# Patient Record
Sex: Female | Born: 1975 | Race: Black or African American | Hispanic: No | Marital: Married | Smoking: Never smoker
Health system: Southern US, Community
[De-identification: ages and names within clinical notes are randomized; demographics above are authoritative.]

## PROBLEM LIST (undated history)

## (undated) HISTORY — PX: FOOT SURGERY: SHX648

---

## 2006-08-22 ENCOUNTER — Emergency Department: Payer: Self-pay | Admitting: Emergency Medicine

## 2009-03-15 ENCOUNTER — Ambulatory Visit: Payer: Self-pay | Admitting: Internal Medicine

## 2010-08-01 ENCOUNTER — Ambulatory Visit: Payer: Self-pay | Admitting: Internal Medicine

## 2011-05-27 ENCOUNTER — Ambulatory Visit: Payer: Self-pay | Admitting: Internal Medicine

## 2011-07-18 ENCOUNTER — Ambulatory Visit: Payer: Self-pay

## 2012-01-24 ENCOUNTER — Ambulatory Visit: Payer: Self-pay | Admitting: Emergency Medicine

## 2012-01-24 LAB — WET PREP, GENITAL

## 2012-04-03 ENCOUNTER — Emergency Department: Payer: Self-pay | Admitting: Emergency Medicine

## 2012-04-03 LAB — CBC
HGB: 13.2 g/dL (ref 12.0–16.0)
MCH: 30.8 pg (ref 26.0–34.0)
Platelet: 195 10*3/uL (ref 150–440)

## 2012-04-04 LAB — BASIC METABOLIC PANEL
Anion Gap: 10 (ref 7–16)
BUN: 12 mg/dL (ref 7–18)
Chloride: 103 mmol/L (ref 98–107)
Co2: 27 mmol/L (ref 21–32)
EGFR (African American): >60
EGFR (Non-African Amer.): >60
Potassium: 3.9 mmol/L (ref 3.5–5.1)

## 2012-04-04 LAB — CK TOTAL AND CKMB (NOT AT ARMC)
CK, Total: 168 U/L (ref 21–215)
CK-MB: 0.8 ng/mL (ref 0.5–3.6)

## 2012-04-04 LAB — TROPONIN I: Troponin-I: <0.02 ng/mL

## 2012-11-27 ENCOUNTER — Emergency Department: Payer: Self-pay | Admitting: Emergency Medicine

## 2012-11-27 LAB — COMPREHENSIVE METABOLIC PANEL
Albumin: 3.9 g/dL (ref 3.4–5.0)
Anion Gap: 5 — ABNORMAL LOW (ref 7–16)
BUN: 8 mg/dL (ref 7–18)
Calcium, Total: 9 mg/dL (ref 8.5–10.1)
Chloride: 106 mmol/L (ref 98–107)
Co2: 28 mmol/L (ref 21–32)
Creatinine: 0.91 mg/dL (ref 0.60–1.30)
EGFR (Non-African Amer.): >60
Glucose: 97 mg/dL (ref 65–99)
Potassium: 3.6 mmol/L (ref 3.5–5.1)
SGPT (ALT): 27 U/L (ref 12–78)
Sodium: 139 mmol/L (ref 136–145)
Total Protein: 7.6 g/dL (ref 6.4–8.2)

## 2012-11-27 LAB — CBC
HCT: 37.5 % (ref 35.0–47.0)
HGB: 12.6 g/dL (ref 12.0–16.0)
MCH: 30 pg (ref 26.0–34.0)
MCHC: 33.7 g/dL (ref 32.0–36.0)
MCV: 89 fL (ref 80–100)
Platelet: 205 10*3/uL (ref 150–440)
RBC: 4.21 10*6/uL (ref 3.80–5.20)
WBC: 6.7 10*3/uL (ref 3.6–11.0)

## 2012-12-03 ENCOUNTER — Ambulatory Visit: Payer: Self-pay | Admitting: Gastroenterology

## 2012-12-20 ENCOUNTER — Ambulatory Visit: Payer: Self-pay | Admitting: Gastroenterology

## 2013-02-22 ENCOUNTER — Ambulatory Visit: Payer: Self-pay | Admitting: Physician Assistant

## 2013-05-06 ENCOUNTER — Ambulatory Visit: Payer: Self-pay | Admitting: Family Medicine

## 2013-05-06 LAB — URINALYSIS, COMPLETE
Bilirubin,UR: NEGATIVE
Blood: NEGATIVE
Glucose,UR: NEGATIVE mg/dL (ref 0–75)
KETONE: NEGATIVE
Nitrite: NEGATIVE
PROTEIN: NEGATIVE
Ph: 7.5 (ref 4.5–8.0)
Specific Gravity: 1.005 (ref 1.003–1.030)

## 2013-05-06 LAB — GC/CHLAMYDIA PROBE AMP

## 2013-05-06 LAB — WET PREP, GENITAL

## 2013-09-19 ENCOUNTER — Ambulatory Visit: Payer: Self-pay | Admitting: Physician Assistant

## 2014-08-15 IMAGING — CR RIGHT GREAT TOE
3 series · 3 of 3 positions shown · non-contrast
Comparison: None.

CLINICAL DATA: Laceration of the left great toe secondary to blunt
trauma.

EXAM:
RIGHT GREAT TOE

[toe ap]
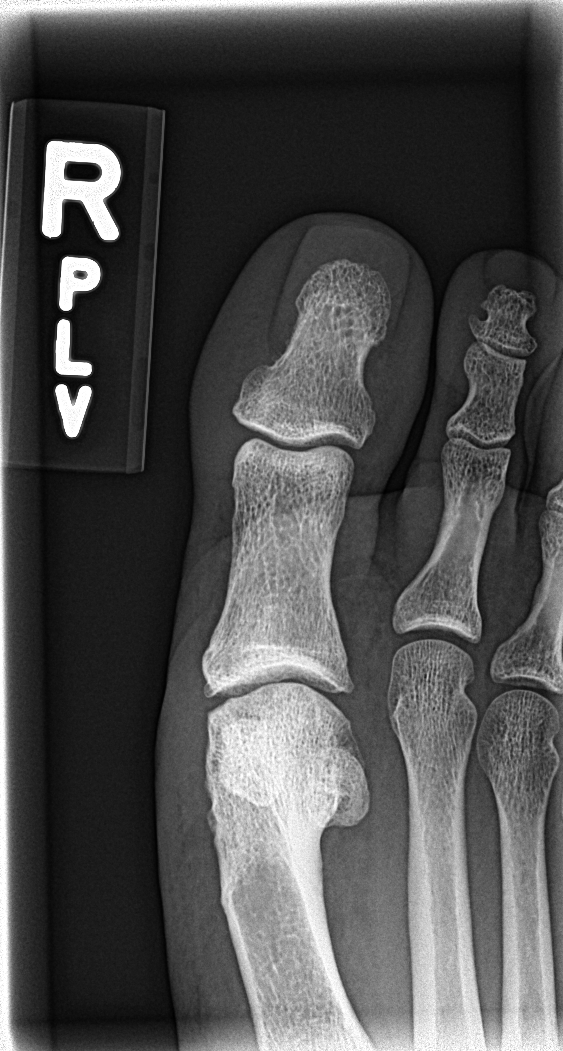

[toe obl]
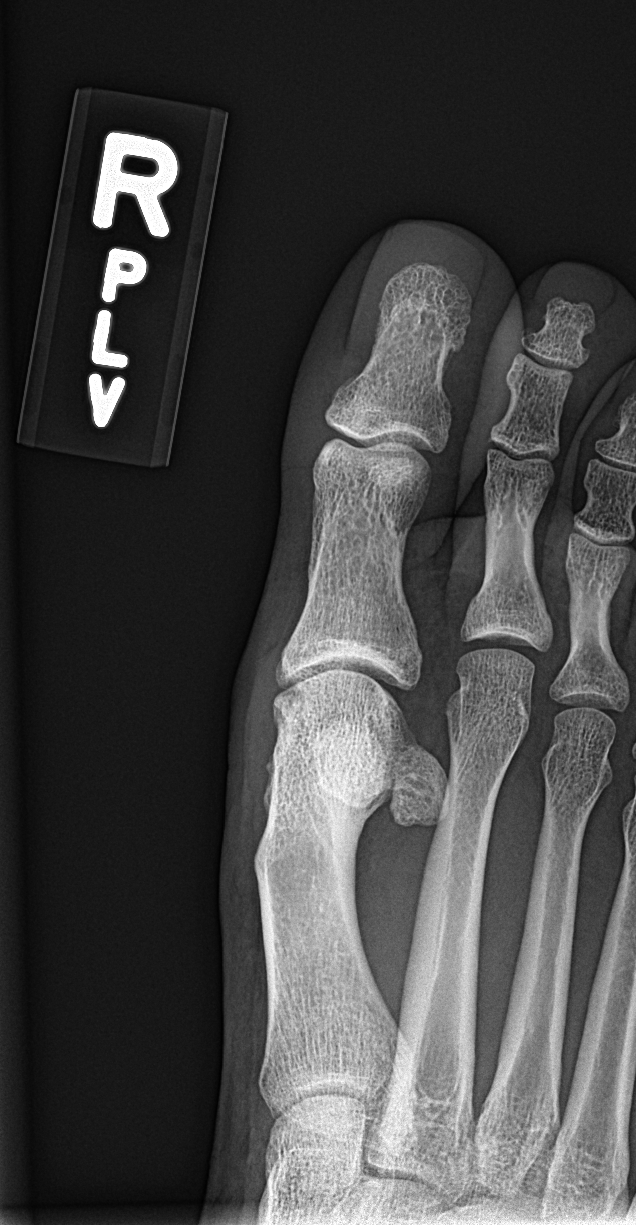

[toe lat]
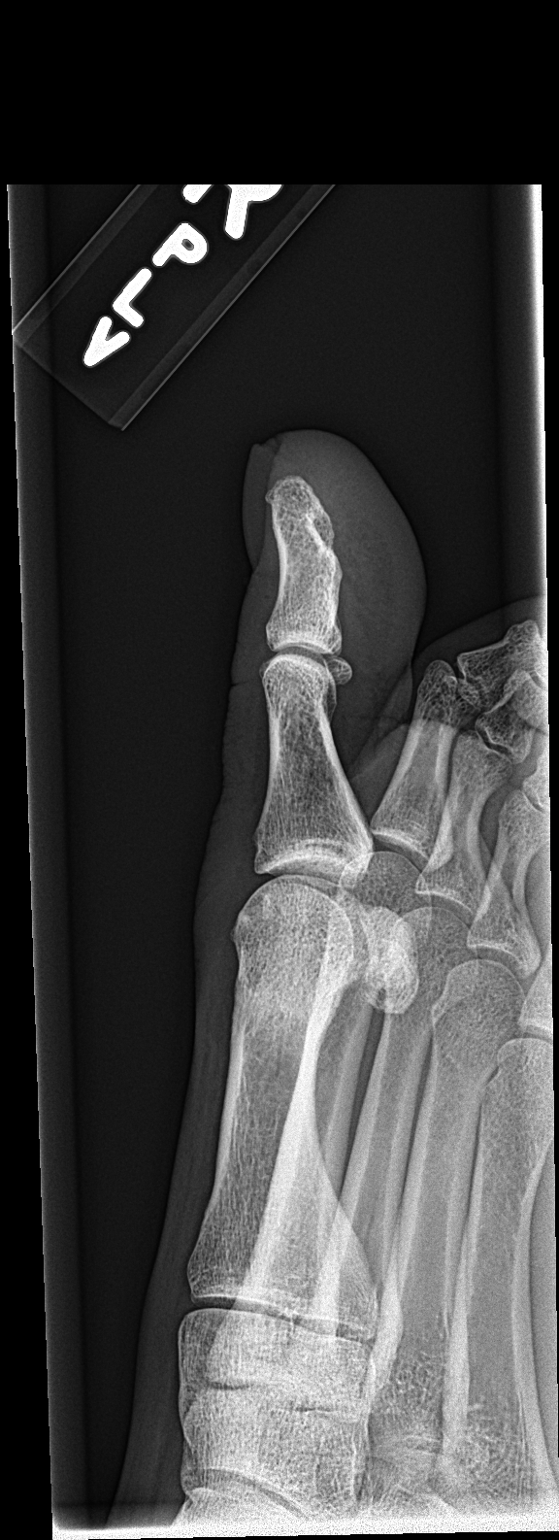

[3 of 3 positions shown; findings below may reference images not displayed]

FINDINGS: There is no fracture or dislocation. Slight osteoarthritis of the
first metatarsophalangeal joint. Findings suggest previous
bunionectomy.
IMPRESSION: No acute abnormalities.  DJD of the first metatarsophalangeal joint.

## 2015-01-06 ENCOUNTER — Encounter: Payer: Self-pay | Admitting: Emergency Medicine

## 2015-01-06 ENCOUNTER — Ambulatory Visit
Admission: EM | Admit: 2015-01-06 | Discharge: 2015-01-06 | Disposition: A | Discharge status: Home / Self Care | Payer: 59 | Attending: Family Medicine | Admitting: Family Medicine

## 2015-01-06 DIAGNOSIS — B349 Viral infection, unspecified: Secondary | ICD-10-CM

## 2015-01-06 LAB — RAPID STREP SCREEN (MED CTR MEBANE ONLY): Streptococcus, Group A Screen (Direct): NEGATIVE

## 2015-01-06 MED ORDER — BENZONATATE 100 MG PO CAPS: 100.0000 mg | ORAL_CAPSULE | Freq: Three times a day (TID) | ORAL | Status: AC | PRN

## 2015-01-06 MED ORDER — LORATADINE-PSEUDOEPHEDRINE ER 5-120 MG PO TB12: 1.0000 | ORAL_TABLET | Freq: Two times a day (BID) | ORAL | Status: AC

## 2015-01-06 NOTE — Discharge Instructions (Signed)
Treat symptoms..increase fluids..... tylenol /ibuprofen cyclically. Steamy showers  Robitussin DM  ( Guaifenesin DM )  1 tsp every  4 hours to liquefy mucous Viral Infections A viral infection can be caused by different types of viruses.Most viral infections are not serious and resolve on their own. However, some infections may cause severe symptoms and may lead to further complications. SYMPTOMS Viruses can frequently cause:  Minor sore throat.  Aches and pains.  Headaches.  Runny nose.  Different types of rashes.  Watery eyes.  Tiredness.  Cough.  Loss of appetite.  Gastrointestinal infections, resulting in nausea, vomiting, and diarrhea. These symptoms do not respond to antibiotics because the infection is not caused by bacteria. However, you might catch a bacterial infection following the viral infection. This is sometimes called a "superinfection." Symptoms of such a bacterial infection may include:  Worsening sore throat with pus and difficulty swallowing.  Swollen neck glands.  Chills and a high or persistent fever.  Severe headache.  Tenderness over the sinuses.  Persistent overall ill feeling (malaise), muscle aches, and tiredness (fatigue).  Persistent cough.  Yellow, green, or brown mucus production with coughing. HOME CARE INSTRUCTIONS   Only take over-the-counter or prescription medicines for pain, discomfort, diarrhea, or fever as directed by your caregiver.  Drink enough water and fluids to keep your urine clear or pale yellow. Sports drinks can provide valuable electrolytes, sugars, and hydration.  Get plenty of rest and maintain proper nutrition. Soups and broths with crackers or rice are fine. SEEK IMMEDIATE MEDICAL CARE IF:   You have severe headaches, shortness of breath, chest pain, neck pain, or an unusual rash.  You have uncontrolled vomiting, diarrhea, or you are unable to keep down fluids.  You or your child has an oral temperature  above 102 F (38.9 C), not controlled by medicine.  Your baby is older than 3 months with a rectal temperature of 102 F (38.9 C) or higher.  Your baby is 643 months old or younger with a rectal temperature of 100.4 F (38 C) or higher. MAKE SURE YOU:   Understand these instructions.  Will watch your condition.  Will get help right away if you are not doing well or get worse.   This information is not intended to replace advice given to you by your health care provider. Make sure you discuss any questions you have with your health care provider.   Document Released: 11/27/2004 Document Revised: 05/12/2011 Document Reviewed: 07/26/2014 Elsevier Interactive Patient Education [employer]2016 [employer]vier Inc.

## 2015-01-06 NOTE — ED Notes (Signed)
Patient c/o sore throat, cough, chest congestion, sinus pressure and congestion, and HAs since yesterday.

## 2015-01-07 ENCOUNTER — Encounter: Payer: Self-pay | Admitting: Physician Assistant

## 2015-01-07 NOTE — ED Provider Notes (Signed)
CSN: [id number]     Arrival date & time 01/06/15  1059 History   First MD Initiated Contact with Patient 01/06/15 1206     Chief Complaint  Patient presents with  . Cough  . Sore Throat  . Facial Pain  . Nasal Congestion   (Consider location/radiation/quality/duration/timing/severity/associated sxs/prior Treatment) HPI 39 yo F with nasal congestion, sore throat, sinus pressure ,annoying cough, ear pressure since yesterday. No fever. Husband and 39 yo and 39 yo have all been il ( not strep)l. She is concerned to r/o strep. Has post nasal drip and non-productive cough  Taking Tylenol severe cold . Menses current History reviewed. No pertinent past medical history. Past Surgical History  Procedure Laterality Date  . Foot surgery Right    Family History  Problem Relation Age of Onset  . Heart disease Sister   . Heart attack Maternal Aunt   . Colon cancer Maternal Grandmother   . Colon cancer Cousin    Social History  Substance Use Topics  . Smoking status: Never Smoker   . Smokeless tobacco: None  . Alcohol Use: No   OB History    Gravida Para Term Preterm AB TAB SAB Ectopic Multiple Living   2 2        2      Review of Systems Constitutional: No fever.  Eyes: No visual changes. ENT ; + sore throat., congestion per HPI Cardiovascular:Negative for chest pain/palpitations Respiratory: Negative for shortness of breath Gastrointestinal: No abdominal pain. No nausea,vomiting, diarrhea Genitourinary: Negative for dysuria. Normal urination. Musculoskeletal: Negative for back pain. FROM extremities without pain Skin: Negative for rash Neurological: Negative for headache, focal weakness or numbness  Allergies  Review of patient's allergies indicates no known allergies.  Home Medications   Prior to Admission medications   Medication Sig Start Date End Date Taking? Authorizing Provider  benzonatate (TESSALON) 100 MG capsule Take 1 capsule (100 mg total) by mouth 3 (three) times  daily as needed. 01/06/15   Rae HalstedLaurie W Willis Kuipers, PA-C  loratadine-pseudoephedrine (CLARITIN-D 12 HOUR) 5-120 MG tablet Take 1 tablet by mouth 2 (two) times daily. 01/06/15   Rae HalstedLaurie W Dmitriy Gair, PA-C   Meds Ordered and Administered this Visit  Medications - No data to display  BP 104/53 mmHg  Pulse 84  Temp(Src) 96.9 F (36.1 C) (Tympanic)  Resp 16  Ht 5\' 4"  (1.626 m)  Wt 150 lb (68.04 kg)  BMI 25.73 kg/m2  SpO2 100%  LMP 01/04/2015 (Exact Date) No data found.   Physical Exam   General: NAD, does not appear toxic HEENT:conjugate gaze, conjunctiva clear,mild pharyngeal erythema, no exudate, + cobblestoning nasal congestion, canals clear,no erythema of TMs, no dental  Issues, no facial tenderness Neck: supple,no lymphadenopathy Resp : CT A, bilat Card : RRR Abd:  Not distended Neuro :face symmetric, :PERRLA, EOMI, tongue midline, shoulder adduction, good attention,recall-good memory,ambulatory without assistance   ED Course  Procedures (including critical care time)  Labs Review Labs Reviewed  RAPID STREP SCREEN (NOT AT [facility])  CULTURE, GROUP A STREP ([facility] ONLY)   Results for orders placed or performed during the hospital encounter of 01/06/15  Rapid strep screen  Result Value Ref Range   Streptococcus, Group A Screen (Direct) NEGATIVE NEGATIVE   Imaging Review No results found.      MDM   1. Viral syndrome    Plan: Test/x-ray results and diagnosis reviewed with patient-3 day callback Rx as per orders;  benefits, risks, potential side effects reviewed   Recommend supportive  treatment with cyclic tylenol/ibuprofen, steamy showers, rest ,hydration Seek additional medical care if symptoms worsen or are not improving  Discharge Medication List as of 01/06/2015 12:26 PM    START taking these medications   Details  benzonatate (TESSALON) 100 MG capsule Take 1 capsule (100 mg total) by mouth 3 (three) times daily as needed., Starting 01/06/2015, Until Discontinued, Normal     loratadine-pseudoephedrine (CLARITIN-D 12 HOUR) 5-120 MG tablet Take 1 tablet by mouth 2 (two) times daily., Starting 01/06/2015, Until Discontinued, Normal           Rae Halsted, PA-C 01/07/15 [phone]

## 2015-01-08 LAB — CULTURE, GROUP A STREP (THRC)

## 2015-01-08 NOTE — ED Notes (Signed)
Final report of strep testing negative  

## 2015-04-21 ENCOUNTER — Ambulatory Visit
Admission: EM | Admit: 2015-04-21 | Discharge: 2015-04-21 | Disposition: A | Discharge status: Home / Self Care | Payer: 59 | Attending: Family Medicine | Admitting: Family Medicine

## 2015-04-21 DIAGNOSIS — R0981 Nasal congestion: Secondary | ICD-10-CM | POA: Diagnosis not present

## 2015-04-21 DIAGNOSIS — J3089 Other allergic rhinitis: Secondary | ICD-10-CM | POA: Diagnosis not present

## 2015-04-21 MED ORDER — AMOXICILLIN-POT CLAVULANATE 875-125 MG PO TABS: 1.0000 | ORAL_TABLET | Freq: Two times a day (BID) | ORAL | Status: AC

## 2015-04-21 NOTE — ED Provider Notes (Signed)
CSN: [id number]     Arrival date & time 04/21/15  1200 History   First MD Initiated Contact with Patient 04/21/15 1500     Chief Complaint  Patient presents with  . 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 Wants therapy so sinus infection doesn't develop- Doesn't want antibiotics . Has ENT established in [facility].Hasn't been able to go while a student. 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 Now certified she is trying to become established with new credentials. Face on Face with public and potential employers. History reviewed. No pertinent past medical history. Past Surgical History  Procedure Laterality Date  . Foot surgery Right    Family History  Problem Relation Age of Onset  . Heart disease Sister   . Heart attack Maternal Aunt   . Colon cancer Maternal Grandmother   . Colon cancer Cousin    Social History  Substance Use Topics  . Smoking status: Never Smoker   . Smokeless tobacco: None  . Alcohol Use: No   OB History    Gravida Para Term Preterm AB TAB SAB Ectopic Multiple Living   Review of Systems.Review of 10 systems negative for acute change except as referenced in HPI   Allergies  Review of patient's allergies indicates no known allergies.  Home Medications   Prior to Admission medications   Medication Sig Start Date End Date Taking? Authorizing Provider  fluticasone (FLONASE) 50 MCG/ACT nasal spray Place into both nostrils  daily.   Yes Historical Provider, MD  loratadine-pseudoephedrine (CLARITIN-D 12 HOUR) 5-120 MG tablet Take 1 tablet by mouth 2 (two) times daily. 01/06/15  Yes Rae Halsted, PA-C  amoxicillin-clavulanate (AUGMENTIN) 875-125 MG tablet Take 1 tablet by mouth every 12 (twelve) hours. 04/21/15   Rae Halsted, PA-C  benzonatate (TESSALON) 100 MG capsule Take 1 capsule (100 mg total) by mouth 3 (three) times daily as needed. 01/06/15   Rae Halsted, PA-C   Meds Ordered and Administered this Visit  Medications - No data to display  BP 118/77 mmHg  Pulse 96  Temp(Src) 97.2 F (36.2 C) (Oral)  Resp 17  Ht  (1.626 m)  Wt 155 lb (70.308 kg)  BMI 26.59 kg/m2  SpO2 100%  LMP 04/02/2015 No data found.   Physical Exam   VS noted, WNL  GENERAL : NAD,  HEENT: no pharyngeal erythema,no exudate, positive post nasal drip/cobblestoning;  no erythema of TMs,canals clear, no cervical LAD; eyes clear.  Sinuses dull to percussion, right maxillary mildly tender; nasal congestion RESP: CTA  B , no wheezing, no accessory muscle use CARD: RRR ABD: Not distended NEURO: Good attention, good recall, no gross neuro defecit PSYCH: speech and behavior appropriate   ED Course  Procedures (including critical care time)  Labs Review Labs Reviewed - No data to display  Imaging Review No results found.     MDM   1. Environmental and seasonal allergies   2. Sinus congestion    Recommend increased hydration,  fluids, vaporizer, steamy shower. Try variation in seasonal allergy control with d/c loratadine and try Zyrtec. Take at bedtime- my try BID on a weekend when not working to see if sleepy-short term trial. Different Rx with change dosing may be more drying for post nasal drip  Add robitussin DM to liquefy mucous inner ear/sinuses but minimize cough Advised initially may see more drainage.  Rx for Augmentin given to hold 1-2 weeks then destroy if not needed.  Respond to fever.malaise, increased  pressure or pain in sinuses. - if started take full Rx Needs to travel in next weeks and will be away from home and usual medical care. Diagnosis and treatment discussed.-handout given   Questions fielded, expectations and recommendations reviewed. Discussed follow up and return parameters including no resolution or any worsening condition..  Patient expresses understanding  and agrees to plan.  Will return to [facility] with questions, concerns or exacerbation.     Rae Halsted, PA-C 04/23/15 1540

## 2015-04-21 NOTE — ED Notes (Signed)
Patient complains of sinus pain and pressure. Patient states that symptoms started last Friday and have been worsening. She states that nose is raw and burning. She states that she has tried multiple OTC treatments with no relief.

## 2015-04-23 ENCOUNTER — Encounter: Payer: Self-pay | Admitting: Physician Assistant

## 2015-05-17 ENCOUNTER — Ambulatory Visit
Admission: EM | Admit: 2015-05-17 | Discharge: 2015-05-17 | Disposition: A | Discharge status: Home / Self Care | Payer: 59 | Attending: Family Medicine | Admitting: Family Medicine

## 2015-05-17 DIAGNOSIS — A09 Infectious gastroenteritis and colitis, unspecified: Secondary | ICD-10-CM | POA: Diagnosis not present

## 2015-05-17 DIAGNOSIS — K529 Noninfective gastroenteritis and colitis, unspecified: Secondary | ICD-10-CM

## 2015-05-17 LAB — CBC WITH DIFFERENTIAL/PLATELET
Basophils Absolute: 0 10*3/uL (ref 0–0.1)
Basophils Relative: 0 %
Eosinophils Absolute: 0 10*3/uL (ref 0–0.7)
Eosinophils Relative: 0 %
HCT: 40.6 % (ref 35.0–47.0)
Hemoglobin: 13.2 g/dL (ref 12.0–16.0)
Lymphocytes Relative: 2 %
Lymphs Abs: 0.2 10*3/uL — ABNORMAL LOW (ref 1.0–3.6)
MCH: 28.6 pg (ref 26.0–34.0)
MCHC: 32.6 g/dL (ref 32.0–36.0)
MCV: 87.8 fL (ref 80.0–100.0)
Monocytes Absolute: 0.3 10*3/uL (ref 0.2–0.9)
Monocytes Relative: 3 %
Neutro Abs: 11.4 10*3/uL — ABNORMAL HIGH (ref 1.4–6.5)
Neutrophils Relative %: 95 %
Platelets: 198 10*3/uL (ref 150–440)
RBC: 4.62 MIL/uL (ref 3.80–5.20)
RDW: 14.7 % — ABNORMAL HIGH (ref 11.5–14.5)
WBC: 11.9 10*3/uL — ABNORMAL HIGH (ref 3.6–11.0)

## 2015-05-17 LAB — COMPREHENSIVE METABOLIC PANEL
ALT: 22 U/L (ref 14–54)
AST: 24 U/L (ref 15–41)
Albumin: 4.5 g/dL (ref 3.5–5.0)
Alkaline Phosphatase: 54 U/L (ref 38–126)
Anion gap: 5 (ref 5–15)
BUN: 17 mg/dL (ref 6–20)
CO2: 26 mmol/L (ref 22–32)
Calcium: 8.8 mg/dL — ABNORMAL LOW (ref 8.9–10.3)
Chloride: 104 mmol/L (ref 101–111)
Creatinine, Ser: 0.89 mg/dL (ref 0.44–1.00)
GFR calc Af Amer: >60 mL/min (ref >60)
GFR calc non Af Amer: >60 mL/min (ref >60)
Glucose, Bld: 105 mg/dL — ABNORMAL HIGH (ref 65–99)
Potassium: 4.1 mmol/L (ref 3.5–5.1)
Sodium: 135 mmol/L (ref 135–145)
Total Bilirubin: 0.8 mg/dL (ref 0.3–1.2)
Total Protein: 8.2 g/dL — ABNORMAL HIGH (ref 6.5–8.1)

## 2015-05-17 MED ORDER — ONDANSETRON 8 MG PO TBDP
8.0000 mg | ORAL_TABLET | Freq: Once | ORAL | Status: AC
  Administered 2015-05-17: 8 mg via ORAL

## 2015-05-17 MED ORDER — ONDANSETRON 8 MG PO TBDP: 8.0000 mg | ORAL_TABLET | Freq: Two times a day (BID) | ORAL | Status: DC

## 2015-05-17 NOTE — ED Provider Notes (Signed)
CSN: [id number]     Arrival date & time 05/17/15  1143 History   First MD Initiated Contact with Patient 05/17/15 1235     Chief Complaint  Patient presents with  . Emesis  . Abdominal Pain   (Consider location/radiation/quality/duration/timing/severity/associated sxs/prior Treatment) HPI  This a 40 year old female who presents with the sudden onset of watery diarrhea 1 vomiting 1 followed by frequent dry heaves. The patient states that she ate [facility] [employer] yesterday and she made a pot roast last  Night. She denies any fever or chills. Does have some left upper quadrant pain whenever she has the dry heaves. Not complaining of any abdominal pain. Denies any mucus or blood in the watery diarrhea. She states she feels very weak.     History reviewed. No pertinent past medical history. Past Surgical History  Procedure Laterality Date  . Foot surgery Right    Family History  Problem Relation Age of Onset  . Heart disease Sister   . Heart attack Maternal Aunt   . Colon cancer Maternal Grandmother   . Colon cancer Cousin    Social History  Substance Use Topics  . Smoking status: Never Smoker   . Smokeless tobacco: None  . Alcohol Use: No   OB History    Gravida Para Term Preterm AB TAB SAB Ectopic Multiple Living   2 2        2      Review of Systems  Constitutional: Positive for activity change, appetite change and fatigue. Negative for fever, chills and diaphoresis.  Gastrointestinal: Positive for nausea, vomiting, abdominal pain and diarrhea. Negative for constipation, blood in stool, abdominal distention, anal bleeding and rectal pain.    Allergies  Review of patient's allergies indicates no known allergies.  Home Medications   Prior to Admission medications   Medication Sig Start Date End Date Taking? Authorizing Provider  amoxicillin-clavulanate (AUGMENTIN) 875-125 MG tablet Take 1 tablet by mouth every 12 (twelve) hours. 04/21/15   Rae HalstedLaurie W Lee, PA-C   benzonatate (TESSALON) 100 MG capsule Take 1 capsule (100 mg total) by mouth 3 (three) times daily as needed. 01/06/15   Rae HalstedLaurie W Lee, PA-C  fluticasone [facility](FLONASE) 50 MCG/ACT nasal spray Place into both nostrils daily.    Historical Provider, MD  loratadine-pseudoephedrine (CLARITIN-D 12 HOUR) 5-120 MG tablet Take 1 tablet by mouth 2 (two) times daily. 01/06/15   Rae HalstedLaurie W Lee, PA-C  ondansetron (ZOFRAN ODT) 8 MG disintegrating tablet Take 1 tablet (8 mg total) by mouth 2 (two) times daily. 05/17/15   Lutricia FeilWilliam P Arbor Leer, PA-C   Meds Ordered and Administered this Visit   Medications  ondansetron (ZOFRAN-ODT) disintegrating tablet 8 mg (8 mg Oral Given 05/17/15 1211)    BP 123/84 mmHg  Pulse 106  Temp(Src) 97.7 F (36.5 C) (Tympanic)  Resp 20  Ht 5\' 4"  (1.626 m)  Wt 160 lb (72.576 kg)  BMI 27.45 kg/m2  SpO2 99%  LMP 05/03/2015 No data found.   Physical Exam  Constitutional: She is oriented to person, place, and time. She appears well-developed and well-nourished. No distress.  HENT:  Head: Normocephalic and atraumatic.  Right Ear: External ear normal.  Left Ear: External ear normal.  Eyes: Conjunctivae are normal. Pupils are equal, round, and reactive to light.  Neck: Normal range of motion. Neck supple.  Pulmonary/Chest: Effort normal and breath sounds normal. No respiratory distress. She has no wheezes. She has no rales.  Abdominal: Soft. She exhibits no distension and no mass. There  is no tenderness. There is no rebound and no guarding.  Shows hypotonic bowel sounds  Musculoskeletal: Normal range of motion. She exhibits no edema or tenderness.  Neurological: She is alert and oriented to person, place, and time.  Skin: Skin is warm and dry. She is not diaphoretic.  Psychiatric: She has a normal mood and affect. Her behavior is normal. Judgment and thought content normal.  Nursing note and vitals reviewed.   ED Course  Procedures (including critical care time)  Labs Review Labs  Reviewed  CBC WITH DIFFERENTIAL/PLATELET - Abnormal; Notable for the following:    WBC 11.9 (*)    RDW 14.7 (*)    Neutro Abs 11.4 (*)    Lymphs Abs 0.2 (*)    All other components within normal limits  COMPREHENSIVE METABOLIC PANEL - Abnormal; Notable for the following:    Glucose, Bld 105 (*)    Calcium 8.8 (*)    Total Protein 8.2 (*)    All other components within normal limits    Imaging Review No results found.   Visual Acuity Review  Right Eye Distance:   Left Eye Distance:   Bilateral Distance:    Right Eye Near:   Left Eye Near:    Bilateral Near:         MDM   1. Gastroenteritis presumed infectious    New Prescriptions   ONDANSETRON (ZOFRAN ODT) 8 MG DISINTEGRATING TABLET    Take 1 tablet (8 mg total) by mouth 2 (two) times daily.  Plan: 1. Test/x-ray results and diagnosis reviewed with patient 2. rx as per orders; risks, benefits, potential side effects reviewed with patient 3. Recommend supportive treatment with Increase fluids. May advance to food as tolerated. I've asked her to follow-up with her primary care next week if she is not improving. If she does get worse she needs to go to emergency department 4. F/u prn if symptoms worsen or don't improve     Lutricia Feil, PA-C 05/17/15 1445

## 2015-05-17 NOTE — ED Notes (Addendum)
Patient c/o vomiting since 1am this morning, stomach pain, diarrhea, weakness, dry heaving, cold sweats.  All symptoms started since 1:00am this morning.

## 2015-05-17 NOTE — Discharge Instructions (Signed)
Diarrhea Diarrhea is frequent loose and watery bowel movements. It can cause you to feel weak and dehydrated. Dehydration can cause you to become tired and thirsty, have a dry mouth, and have decreased urination that often is dark yellow. Diarrhea is a sign of another problem, most often an infection that will not last long. In most cases, diarrhea typically lasts 2-3 days. However, it can last longer if it is a sign of something more serious. It is important to treat your diarrhea as directed by your caregiver to lessen or prevent future episodes of diarrhea. CAUSES  Some common causes include:  Gastrointestinal infections caused by viruses, bacteria, or parasites.  Food poisoning or food allergies.  Certain medicines, such as antibiotics, chemotherapy, and laxatives.  Artificial sweeteners and fructose.  Digestive disorders. HOME CARE INSTRUCTIONS  Ensure adequate fluid intake (hydration): Have 1 cup (8 oz) of fluid for each diarrhea episode. Avoid fluids that contain simple sugars or sports drinks, fruit juices, whole milk products, and sodas. Your urine should be clear or pale yellow if you are drinking enough fluids. Hydrate with an oral rehydration solution that you can purchase at pharmacies, retail stores, and online. You can prepare an oral rehydration solution at home by mixing the following ingredients together:   - tsp table salt.   tsp baking soda.   tsp salt substitute containing potassium chloride.  1  tablespoons sugar.  1 L (34 oz) of water.  Certain foods and beverages may increase the speed at which food moves through the gastrointestinal (GI) tract. These foods and beverages should be avoided and include:  Caffeinated and alcoholic beverages.  High-fiber foods, such as raw fruits and vegetables, nuts, seeds, and whole grain breads and cereals.  Foods and beverages sweetened with sugar alcohols, such as xylitol, sorbitol, and mannitol.  Some foods may be well  tolerated and may help thicken stool including:  Starchy foods, such as rice, toast, pasta, low-sugar cereal, oatmeal, grits, baked potatoes, crackers, and bagels.  Bananas.  Applesauce.  Add probiotic-rich foods to help increase healthy bacteria in the GI tract, such as yogurt and fermented milk products.  Wash your hands well after each diarrhea episode.  Only take over-the-counter or prescription medicines as directed by your caregiver.  Take a warm bath to relieve any burning or pain from frequent diarrhea episodes. SEEK IMMEDIATE MEDICAL CARE IF:   You are unable to keep fluids down.  You have persistent vomiting.  You have blood in your stool, or your stools are black and tarry.  You do not urinate in 6-8 hours, or there is only a small amount of very dark urine.  You have abdominal pain that increases or localizes.  You have weakness, dizziness, confusion, or light-headedness.  You have a severe headache.  Your diarrhea gets worse or does not get better.  You have a fever or persistent symptoms for more than 2-3 days.  You have a fever and your symptoms suddenly get worse. MAKE SURE YOU:   Understand these instructions.  Will watch your condition.  Will get help right away if you are not doing well or get worse.   This information is not intended to replace advice given to you by your health care provider. Make sure you discuss any questions you have with your health care provider.   Document Released: 02/07/2002 Document Revised: 03/10/2014 Document Reviewed: 10/26/2011 Elsevier Interactive Patient Education 2016 [employer]   Food Choices to Help Relieve Diarrhea, Adult When you have   diarrhea, the foods you eat and your eating habits are very important. Choosing the right foods and drinks can help relieve diarrhea. Also, because diarrhea can last up to 7 days, you need to replace lost fluids and electrolytes (such as sodium, potassium, and chloride) in  order to help prevent dehydration.  WHAT GENERAL GUIDELINES DO I NEED TO FOLLOW?  Slowly drink 1 cup (8 oz) of fluid for each episode of diarrhea. If you are getting enough fluid, your urine will be clear or pale yellow.  Eat starchy foods. Some good choices include white rice, white toast, pasta, low-fiber cereal, baked potatoes (without the skin), saltine crackers, and bagels.  Avoid large servings of any cooked vegetables.  Limit fruit to two servings per day. A serving is  cup or 1 small piece.  Choose foods with less than 2 g of fiber per serving.  Limit fats to less than 8 tsp (38 g) per day.  Avoid fried foods.  Eat foods that have probiotics in them. Probiotics can be found in certain dairy products.  Avoid foods and beverages that may increase the speed at which food moves through the stomach and intestines (gastrointestinal tract). Things to avoid include:  High-fiber foods, such as dried fruit, raw fruits and vegetables, nuts, seeds, and whole grain foods.  Spicy foods and high-fat foods.  Foods and beverages sweetened with high-fructose corn syrup, honey, or sugar alcohols such as xylitol, sorbitol, and mannitol. WHAT FOODS ARE RECOMMENDED? Grains White rice. White, French, or pita breads (fresh or toasted), including plain rolls, buns, or bagels. White pasta. Saltine, soda, or graham crackers. Pretzels. Low-fiber cereal. Cooked cereals made with water (such as cornmeal, farina, or cream cereals). Plain muffins. Matzo. Melba toast. Zwieback.  Vegetables Potatoes (without the skin). Strained tomato and vegetable juices. Most well-cooked and canned vegetables without seeds. Tender lettuce. Fruits Cooked or canned applesauce, apricots, cherries, fruit cocktail, grapefruit, peaches, pears, or plums. Fresh bananas, apples without skin, cherries, grapes, cantaloupe, grapefruit, peaches, oranges, or plums.  Meat and Other Protein Products Baked or boiled chicken. Eggs. Tofu.  Fish. Seafood. Smooth peanut butter. Ground or well-cooked tender beef, ham, veal, lamb, pork, or poultry.  Dairy Plain yogurt, kefir, and unsweetened liquid yogurt. Lactose-free milk, buttermilk, or soy milk. Plain hard cheese. Beverages Sport drinks. Clear broths. Diluted fruit juices (except prune). Regular, caffeine-free sodas such as ginger ale. Water. Decaffeinated teas. Oral rehydration solutions. Sugar-free beverages not sweetened with sugar alcohols. Other Bouillon, broth, or soups made from recommended foods.  The items listed above may not be a complete list of recommended foods or beverages. Contact your dietitian for more options. WHAT FOODS ARE NOT RECOMMENDED? Grains Whole grain, whole wheat, bran, or rye breads, rolls, pastas, crackers, and cereals. Wild or brown rice. Cereals that contain more than 2 g of fiber per serving. Corn tortillas or taco shells. Cooked or dry oatmeal. Granola. Popcorn. Vegetables Raw vegetables. Cabbage, broccoli, Brussels sprouts, artichokes, baked beans, beet greens, corn, kale, legumes, peas, sweet potatoes, and yams. Potato skins. Cooked spinach and cabbage. Fruits Dried fruit, including raisins and dates. Raw fruits. Stewed or dried prunes. Fresh apples with skin, apricots, mangoes, pears, raspberries, and strawberries.  Meat and Other Protein Products Chunky peanut butter. Nuts and seeds. Beans and lentils. Bacon.  Dairy High-fat cheeses. Milk, chocolate milk, and beverages made with milk, such as milk shakes. Cream. Ice cream. Sweets and Desserts Sweet rolls, doughnuts, and sweet breads. Pancakes and waffles. Fats and Oils Butter. Cream sauces.   Margarine. Salad oils. Plain salad dressings. Olives. Avocados.  Beverages Caffeinated beverages (such as coffee, tea, soda, or energy drinks). Alcoholic beverages. Fruit juices with pulp. Prune juice. Soft drinks sweetened with high-fructose corn syrup or sugar alcohols. Other Coconut. Hot sauce.  Chili powder. Mayonnaise. Gravy. Cream-based or milk-based soups.  The items listed above may not be a complete list of foods and beverages to avoid. Contact your dietitian for more information. WHAT SHOULD I DO IF I BECOME DEHYDRATED? Diarrhea can sometimes lead to dehydration. Signs of dehydration include dark urine and dry mouth and skin. If you think you are dehydrated, you should rehydrate with an oral rehydration solution. These solutions can be purchased at pharmacies, retail stores, or online.  Drink -1 cup (120-240 mL) of oral rehydration solution each time you have an episode of diarrhea. If drinking this amount makes your diarrhea worse, try drinking smaller amounts more often. For example, drink 1-3 tsp (5-15 mL) every 5-10 minutes.  A general rule for staying hydrated is to drink 1-2 L of fluid per day. Talk to your health care provider about the specific amount you should be drinking each day. Drink enough fluids to keep your urine clear or pale yellow.   This information is not intended to replace advice given to you by your health care provider. Make sure you discuss any questions you have with your health care provider.   Document Released: 05/10/2003 Document Revised: 03/10/2014 Document Reviewed: 01/10/2013 Elsevier Interactive Patient Education 2016 [employer] Inc.   

## 2016-07-26 ENCOUNTER — Encounter: Payer: Self-pay | Admitting: Emergency Medicine

## 2016-07-26 ENCOUNTER — Ambulatory Visit
Admission: EM | Admit: 2016-07-26 | Discharge: 2016-07-26 | Disposition: A | Discharge status: Home / Self Care | Payer: 59 | Attending: Family Medicine | Admitting: Family Medicine

## 2016-07-26 DIAGNOSIS — H8110 Benign paroxysmal vertigo, unspecified ear: Secondary | ICD-10-CM

## 2016-07-26 DIAGNOSIS — R110 Nausea: Secondary | ICD-10-CM

## 2016-07-26 MED ORDER — MECLIZINE HCL 25 MG PO TABS: 25.0000 mg | ORAL_TABLET | Freq: Three times a day (TID) | ORAL | 0 refills | Status: AC | PRN

## 2016-07-26 MED ORDER — PREDNISONE 50 MG PO TABS
60.0000 mg | ORAL_TABLET | Freq: Once | ORAL | Status: AC
  Administered 2016-07-26: 60 mg via ORAL

## 2016-07-26 MED ORDER — PREDNISONE 20 MG PO TABS: ORAL_TABLET | ORAL | 0 refills | Status: AC

## 2016-07-26 MED ORDER — ONDANSETRON 8 MG PO TBDP: 8.0000 mg | ORAL_TABLET | Freq: Three times a day (TID) | ORAL | 0 refills | Status: AC | PRN

## 2016-07-26 MED ORDER — MECLIZINE HCL 25 MG PO TABS
25.0000 mg | ORAL_TABLET | Freq: Once | ORAL | Status: AC
Start: 2016-07-26 — End: 2016-07-26
  Administered 2016-07-26: 25 mg via ORAL

## 2016-07-26 NOTE — ED Triage Notes (Signed)
Woke up this morning with dizziness, bausea nad vomiting.

## 2016-07-26 NOTE — ED Provider Notes (Signed)
[facility]    CSN: [id number] Arrival date & time: 07/26/16  [id number]     History   Chief Complaint Chief Complaint  Patient presents with  . Dizziness  . Nausea    HPI Katherine Lambert is a 41 y.o. female.    Dizziness  Quality:  Vertigo Severity:  Severe Onset quality:  Sudden Duration:  6 hours (has been having episodes of vertigo since waking up this morning) Timing:  Intermittent Progression:  Unchanged Context: bending over and head movement   Relieved by:  Being still and medication Worsened by:  Turning head and movement Associated symptoms: nausea and vomiting (once this morning)   Associated symptoms: no blood in stool, no chest pain, no diarrhea, no headaches, no hearing loss, no palpitations, no shortness of breath, no syncope, no tinnitus and no vision changes   Associated symptoms comment:  Took some zofran she had at home with some relief Risk factors comment:  Recent viral URI   History reviewed. No pertinent past medical history.  There are no active problems to display for this patient.   Past Surgical History:  Procedure Laterality Date  . FOOT SURGERY Right     OB History    Gravida Para Term Preterm AB Living   2 2       2    SAB TAB Ectopic Multiple Live Births                   Home Medications    Prior to Admission medications   Medication Sig Start Date End Date Taking? Authorizing Provider  amoxicillin-clavulanate (AUGMENTIN) 875-125 MG tablet Take 1 tablet by mouth every 12 (twelve) hours. 04/21/15   Rae HalstedLee, Laurie W, PA-C  benzonatate (TESSALON) 100 MG capsule Take 1 capsule (100 mg total) by mouth 3 (three) times daily as needed. 01/06/15   Rae HalstedLee, Laurie W, PA-C  fluticasone [facility](FLONASE) 50 MCG/ACT nasal spray Place into both nostrils daily.    [provider]  loratadine-pseudoephedrine (CLARITIN-D 12 HOUR) 5-120 MG tablet Take 1 tablet by mouth 2 (two) times daily. 01/06/15   Rae HalstedLee, Laurie W, PA-C  meclizine  (ANTIVERT) 25 MG tablet Take 1 tablet (25 mg total) by mouth 3 (three) times daily as needed for dizziness. 07/26/16   Payton Mccallumonty, Aleicia Kenagy, MD  ondansetron (ZOFRAN ODT) 8 MG disintegrating tablet Take 1 tablet (8 mg total) by mouth every 8 (eight) hours as needed for nausea or vomiting. 07/26/16   Payton Mccallumonty, Gareld Obrecht, MD  predniSONE (DELTASONE) 20 MG tablet 2 tabs po qd 07/26/16   Payton Mccallumonty, Sohail Capraro, MD    Family History Family History  Problem Relation Age of Onset  . Heart disease Sister   . Heart attack Maternal Aunt   . Colon cancer Maternal Grandmother   . Colon cancer Cousin     Social History Social History  Substance Use Topics  . Smoking status: Never Smoker  . Smokeless tobacco: Never Used  . Alcohol use No     Allergies   Patient has no known allergies.   Review of Systems Review of Systems  HENT: Negative for hearing loss and tinnitus.   Respiratory: Negative for shortness of breath.   Cardiovascular: Negative for chest pain, palpitations and syncope.  Gastrointestinal: Positive for nausea and vomiting (once this morning). Negative for blood in stool and diarrhea.  Neurological: Positive for dizziness. Negative for headaches.     Physical Exam Triage Vital Signs ED Triage Vitals  Enc Vitals Group  BP 07/26/16 1105 125/71     Pulse Rate 07/26/16 1105 70     Resp 07/26/16 1105 18     Temp 07/26/16 1105 98.9 F (37.2 C)     Temp Source 07/26/16 1105 Oral     SpO2 07/26/16 1105 100 %     Weight 07/26/16 1107 160 lb (72.6 kg)     Height 07/26/16 1107 5\' 4"  (1.626 m)     Head Circumference --      Peak Flow --      Pain Score --      Pain Loc --      Pain Edu? --      Excl. in GC? --    No data found.   Updated Vital Signs BP 125/71 (BP Location: Right Arm)   Pulse 70   Temp 98.9 F (37.2 C) (Oral)   Resp 18   Ht 5\' 4"  (1.626 m)   Wt 160 lb (72.6 kg)   SpO2 100%   BMI 27.46 kg/m   Visual Acuity Right Eye Distance:   Left Eye Distance:   Bilateral  Distance:    Right Eye Near:   Left Eye Near:    Bilateral Near:     Physical Exam  Constitutional: She is oriented to person, place, and time. She appears well-developed and well-nourished. No distress.  HENT:  Head: Normocephalic and atraumatic.  Right Ear: Tympanic membrane, external ear and ear canal normal.  Left Ear: Tympanic membrane, external ear and ear canal normal.  Nose: No mucosal edema, rhinorrhea, nose lacerations, sinus tenderness, nasal deformity, septal deviation or nasal septal hematoma. No epistaxis.  No foreign bodies. Right sinus exhibits no maxillary sinus tenderness and no frontal sinus tenderness. Left sinus exhibits no maxillary sinus tenderness and no frontal sinus tenderness.  Mouth/Throat: Uvula is midline, oropharynx is clear and moist and mucous membranes are normal. No oropharyngeal exudate.  Eyes: Conjunctivae and EOM are normal. Pupils are equal, round, and reactive to light. Right eye exhibits no discharge. Left eye exhibits no discharge. No scleral icterus.  Neck: Normal range of motion. Neck supple. No thyromegaly present.  Cardiovascular: Normal rate, regular rhythm and normal heart sounds.   Pulmonary/Chest: Effort normal and breath sounds normal. No respiratory distress. She has no wheezes. She has no rales.  Lymphadenopathy:    She has no cervical adenopathy.  Neurological: She is alert and oriented to person, place, and time. She displays normal reflexes. No cranial nerve deficit. She exhibits normal muscle tone. Coordination normal.  Positive Hallpike maneuver   Skin: She is not diaphoretic.  Nursing note and vitals reviewed.    UC Treatments / Results  Labs (all labs ordered are listed, but only abnormal results are displayed) Labs Reviewed - No data to display  EKG  EKG Interpretation None       Radiology No results found.  Procedures Procedures (including critical care time)  Medications Ordered in UC Medications  predniSONE  (DELTASONE) tablet 60 mg (60 mg Oral Given 07/26/16 1141)  meclizine (ANTIVERT) tablet 25 mg (25 mg Oral Given 07/26/16 1140)     Initial Impression / Assessment and Plan / UC Course  I have reviewed the triage vital signs and the nursing notes.  Pertinent labs & imaging results that were available during my care of the patient were reviewed by me and considered in my medical decision making (see chart for details).       Final Clinical Impressions(s) / UC Diagnoses  Final diagnoses:  Benign paroxysmal positional vertigo, unspecified laterality    New Prescriptions New Prescriptions   MECLIZINE (ANTIVERT) 25 MG TABLET    Take 1 tablet (25 mg total) by mouth 3 (three) times daily as needed for dizziness.   ONDANSETRON (ZOFRAN ODT) 8 MG DISINTEGRATING TABLET    Take 1 tablet (8 mg total) by mouth every 8 (eight) hours as needed for nausea or vomiting.   PREDNISONE (DELTASONE) 20 MG TABLET    2 tabs po qd   1. diagnosis reviewed with patient 2. Patient given meclizine 25mg  po once and prednisone 60mg  po once with improvement of symptoms 3.  rx as per orders above; reviewed possible side effects, interactions, risks and benefits  3. Recommend supportive treatment with vestibular exercises 4. Follow-up prn if symptoms worsen or don't improve   Payton Mccallum, MD 07/26/16 1158
